# Patient Record
Sex: Female | Born: 1960 | Race: Black or African American | Hispanic: No | Marital: Married | Smoking: Never smoker
Health system: Southern US, Community
[De-identification: ages and names within clinical notes are randomized; demographics above are authoritative.]

## PROBLEM LIST (undated history)

## (undated) DIAGNOSIS — D649 Anemia, unspecified: Secondary | ICD-10-CM

## (undated) DIAGNOSIS — I10 Essential (primary) hypertension: Secondary | ICD-10-CM

## (undated) HISTORY — PX: TUBAL LIGATION: SHX77

---

## 2004-02-11 ENCOUNTER — Ambulatory Visit: Payer: Self-pay | Admitting: Unknown Physician Specialty

## 2005-11-02 ENCOUNTER — Ambulatory Visit: Payer: Self-pay | Admitting: Unknown Physician Specialty

## 2006-11-24 ENCOUNTER — Ambulatory Visit: Payer: Self-pay | Admitting: Unknown Physician Specialty

## 2008-02-20 ENCOUNTER — Ambulatory Visit: Payer: Self-pay | Admitting: Unknown Physician Specialty

## 2009-07-03 ENCOUNTER — Ambulatory Visit: Payer: Self-pay | Admitting: Internal Medicine

## 2010-12-22 ENCOUNTER — Ambulatory Visit: Payer: Self-pay | Admitting: Internal Medicine

## 2011-12-23 ENCOUNTER — Ambulatory Visit: Payer: Self-pay | Admitting: Internal Medicine

## 2013-11-08 ENCOUNTER — Ambulatory Visit: Payer: Self-pay | Admitting: Family Medicine

## 2015-04-01 ENCOUNTER — Other Ambulatory Visit: Payer: Self-pay | Admitting: Family Medicine

## 2015-04-01 DIAGNOSIS — Z1231 Encounter for screening mammogram for malignant neoplasm of breast: Secondary | ICD-10-CM

## 2016-10-20 ENCOUNTER — Other Ambulatory Visit: Payer: Self-pay | Admitting: Family Medicine

## 2016-10-20 DIAGNOSIS — Z1231 Encounter for screening mammogram for malignant neoplasm of breast: Secondary | ICD-10-CM

## 2016-11-05 ENCOUNTER — Other Ambulatory Visit: Payer: Self-pay | Admitting: Family Medicine

## 2016-11-05 DIAGNOSIS — D62 Acute posthemorrhagic anemia: Secondary | ICD-10-CM

## 2016-11-10 ENCOUNTER — Ambulatory Visit
Admission: RE | Admit: 2016-11-10 | Discharge: 2016-11-10 | Disposition: A | Discharge status: Home / Self Care | Payer: BLUE CROSS/BLUE SHIELD | Source: Ambulatory Visit | Attending: Family Medicine | Admitting: Family Medicine

## 2016-11-10 DIAGNOSIS — D649 Anemia, unspecified: Secondary | ICD-10-CM | POA: Diagnosis present

## 2016-11-10 DIAGNOSIS — D62 Acute posthemorrhagic anemia: Secondary | ICD-10-CM

## 2017-01-04 ENCOUNTER — Ambulatory Visit
Admission: RE | Admit: 2017-01-04 | Discharge: 2017-01-04 | Disposition: A | Discharge status: Home / Self Care | Payer: BLUE CROSS/BLUE SHIELD | Source: Ambulatory Visit | Attending: Family Medicine | Admitting: Family Medicine

## 2017-01-04 DIAGNOSIS — Z1231 Encounter for screening mammogram for malignant neoplasm of breast: Secondary | ICD-10-CM | POA: Diagnosis present

## 2017-03-04 ENCOUNTER — Encounter: Payer: Self-pay | Admitting: *Deleted

## 2017-03-04 ENCOUNTER — Emergency Department
Admission: EM | Admit: 2017-03-04 | Discharge: 2017-03-04 | Disposition: A | Discharge status: Home / Self Care | Payer: BLUE CROSS/BLUE SHIELD | Attending: Emergency Medicine | Admitting: Emergency Medicine

## 2017-03-04 ENCOUNTER — Other Ambulatory Visit: Payer: Self-pay

## 2017-03-04 DIAGNOSIS — N939 Abnormal uterine and vaginal bleeding, unspecified: Secondary | ICD-10-CM

## 2017-03-04 DIAGNOSIS — R000 Tachycardia, unspecified: Secondary | ICD-10-CM | POA: Diagnosis not present

## 2017-03-04 DIAGNOSIS — I10 Essential (primary) hypertension: Secondary | ICD-10-CM | POA: Insufficient documentation

## 2017-03-04 DIAGNOSIS — Z880 Allergy status to penicillin: Secondary | ICD-10-CM | POA: Insufficient documentation

## 2017-03-04 HISTORY — DX: Anemia, unspecified: D64.9

## 2017-03-04 HISTORY — DX: Essential (primary) hypertension: I10

## 2017-03-04 LAB — CBC WITH DIFFERENTIAL/PLATELET
BASOS ABS: 0 10*3/uL (ref 0–0.1)
Basophils Relative: 1 %
EOS PCT: 1 %
Eosinophils Absolute: 0.1 10*3/uL (ref 0–0.7)
HCT: 37.7 % (ref 35.0–47.0)
HEMOGLOBIN: 12.3 g/dL (ref 12.0–16.0)
LYMPHS ABS: 1.2 10*3/uL (ref 1.0–3.6)
LYMPHS PCT: 30 %
MCH: 28.9 pg (ref 26.0–34.0)
MCHC: 32.7 g/dL (ref 32.0–36.0)
MCV: 88.2 fL (ref 80.0–100.0)
Monocytes Absolute: 0.5 10*3/uL (ref 0.2–0.9)
Monocytes Relative: 13 %
NEUTROS PCT: 55 %
Neutro Abs: 2.3 10*3/uL (ref 1.4–6.5)
PLATELETS: 369 10*3/uL (ref 150–440)
RBC: 4.27 MIL/uL (ref 3.80–5.20)
RDW: 13.7 % (ref 11.5–14.5)
WBC: 4.1 10*3/uL (ref 3.6–11.0)

## 2017-03-04 LAB — COMPREHENSIVE METABOLIC PANEL
ALBUMIN: 4 g/dL (ref 3.5–5.0)
ALT: 11 U/L — AB (ref 14–54)
ANION GAP: 9 (ref 5–15)
AST: 21 U/L (ref 15–41)
Alkaline Phosphatase: 70 U/L (ref 38–126)
BUN: 10 mg/dL (ref 6–20)
CHLORIDE: 100 mmol/L — AB (ref 101–111)
CO2: 26 mmol/L (ref 22–32)
Calcium: 9.4 mg/dL (ref 8.9–10.3)
Creatinine, Ser: 0.7 mg/dL (ref 0.44–1.00)
GFR calc non Af Amer: >60 mL/min (ref >60)
GLUCOSE: 98 mg/dL (ref 65–99)
Potassium: 4 mmol/L (ref 3.5–5.1)
SODIUM: 135 mmol/L (ref 135–145)
Total Bilirubin: 0.5 mg/dL (ref 0.3–1.2)
Total Protein: 7.8 g/dL (ref 6.5–8.1)

## 2017-03-04 LAB — TYPE AND SCREEN
ABO/RH(D): O POS
Antibody Screen: NEGATIVE

## 2017-03-04 NOTE — ED Triage Notes (Signed)
Per patient's report, her periods have been absent for 3 months, but restarted 3 days ago and was light. Patient reports flow became heavy last night and was changing tampon and pad every hour. Patient states she spoke to her physician today who couldn't fit her in and was referred to the urgent care who told her they couldn't see her due to tachycardia.

## 2017-03-04 NOTE — ED Provider Notes (Signed)
[facility] Emergency Department Provider Note    ____________________________________________   I have reviewed the triage vital signs and the nursing notes.   HISTORY  Chief Complaint Vaginal Bleeding   History limited by: Not Limited   HPI Ameya Pinnix Camacho is a 57 y.o. female who presents to the emergency department today via [facility] because of concern for vaginal bleeding and tachycardia. The patient states that she has been having vaginal 3 weeks.  Prior to that she had gone 3 months without a period.  For the past 3 weeks she has had some light bleeding until today when it became more severe.  She denies any significant abdominal cramping.  She denies any shortness of breath or lightheadedness.  Denies any history of bleeding disorders.  Initially went to the [facility] where they found her to be tachycardic and sent her to the emergency department.  She states that she did have a transvaginal ultrasound performed at the end in the last year without any concerning findings.   Per medical record review patient has a history of anemia  Past Medical History:  Diagnosis Date  . Anemia   . Hypertension     There are no active problems to display for this patient.   Past Surgical History:  Procedure Laterality Date  . CESAREAN SECTION     x2  . TUBAL LIGATION     2003    Prior to Admission medications   Not on File    Allergies Penicillins  Family History  Problem Relation Age of Onset  . Breast cancer Maternal Aunt 1347    Social History Social History   Tobacco Use  . Smoking status: Never Smoker  . Smokeless tobacco: Never Used  Substance Use Topics  . Alcohol use: Yes    Comment: occasionally  . Drug use: No    Review of Systems Constitutional: No fever/chills Eyes: No visual changes. ENT: No sore throat. Cardiovascular: Denies chest pain. Respiratory: Denies shortness of breath. Gastrointestinal: No  abdominal pain.  No nausea, no vomiting.  No diarrhea.   Genitourinary: Positive for vaginal bleeding. Musculoskeletal: Negative for back pain. Skin: Negative for rash. Neurological: Negative for headaches, focal weakness or numbness.  ____________________________________________   PHYSICAL EXAM:  VITAL SIGNS: ED Triage Vitals  Enc Vitals Group     BP 03/04/17 1229 (!) 141/83     Pulse Rate 03/04/17 1229 (!) 108     Resp 03/04/17 1229 18     Temp 03/04/17 1229 99.1 F (37.3 C)     Temp Source 03/04/17 1229 Oral     SpO2 03/04/17 1229 100 %     Weight 03/04/17 1229 195 lb (88.5 kg)     Height 03/04/17 1229 5\' 6"  (1.676 m)     Head Circumference --      Peak Flow --      Pain Score 03/04/17 1238 0    Constitutional: Alert and oriented. Well appearing and in no distress. Eyes: Conjunctivae are normal.  ENT   Head: Normocephalic and atraumatic.   Nose: No congestion/rhinnorhea.   Mouth/Throat: Mucous membranes are moist.   Neck: No stridor. Hematological/Lymphatic/Immunilogical: No cervical lymphadenopathy. Cardiovascular: Normal rate, regular rhythm.  No murmurs, rubs, or gallops.  Respiratory: Normal respiratory effort without tachypnea nor retractions. Breath sounds are clear and equal bilaterally. No wheezes/rales/rhonchi. Gastrointestinal: Soft and non tender. No rebound. No guarding.  Genitourinary: Deferred Musculoskeletal: Normal range of motion in all extremities. No lower extremity edema.  Neurologic:  Normal speech and language. No gross focal neurologic deficits are appreciated.  Skin:  Skin is warm, dry and intact. No rash noted. Psychiatric: Mood and affect are normal. Speech and behavior are normal. Patient exhibits appropriate insight and judgment.  ____________________________________________    LABS (pertinent positives/negatives)  CBC wbc 4.1, hgb 12.3, plt 369 CMP wnl except chl 100, alt  11  ____________________________________________   EKG  None  ____________________________________________    RADIOLOGY  None  ____________________________________________   PROCEDURES  Procedures  ____________________________________________   INITIAL IMPRESSION / ASSESSMENT AND PLAN / ED COURSE  Pertinent labs & imaging results that were available during my care of the patient were reviewed by me and considered in my medical decision making (see chart for details).  Patient presented to the emergency department today because of concerns for abnormal vaginal bleeding and tachycardia.  By the time of my evaluation the patient was no longer tachycardic.  She also stated that she felt like her bleeding had slowed down since her time here in the emergency department.  Given the fact that she had an ultrasound performed roughly 5 months ago I do not feel another ultrasound is necessarily necessary.  Patient was okay deferring.  She states she does have appointment already scheduled with her primary care doctor in 3 days.  Patient is already on iron pills and hemoglobin levels within normal limits.  We did discuss blood loss return precautions.   ____________________________________________   FINAL CLINICAL IMPRESSION(S) / ED DIAGNOSES  Final diagnoses:  Abnormal vaginal bleeding     Note: This dictation was prepared with Dragon dictation. Any transcriptional errors that result from this process are unintentional     Phineas Semen, MD 03/04/17 1536

## 2017-03-04 NOTE — ED Notes (Signed)
Pt presents today for heavy menstral. [facility] and [facility] 113-130 they sent her here. Pt was changing ultra pad within every hour. Pt HR is now 85. Pt is NAD.

## 2017-03-04 NOTE — ED Notes (Signed)
Signature pad not working at time of discharge, pt expresses verbal understanding of paperwork with no further questions at this time.

## 2017-03-04 NOTE — Discharge Instructions (Signed)
Please seek medical attention for any high fevers, chest pain, shortness of breath, change in behavior, persistent vomiting, bloody stool or any other new or concerning symptoms.  

## 2018-06-28 ENCOUNTER — Other Ambulatory Visit: Payer: Self-pay | Admitting: Family Medicine

## 2018-08-14 ENCOUNTER — Telehealth: Payer: Self-pay | Admitting: *Deleted

## 2018-08-14 DIAGNOSIS — Z20822 Contact with and (suspected) exposure to covid-19: Secondary | ICD-10-CM

## 2018-08-14 NOTE — Telephone Encounter (Signed)
Pt scheduled for testing 08/18/2018 Friday. Requested by [facility] Dept  Pt with possible exposure  Testing process reviewed with pt, wear mask, stay in car; verbalizes understanding.  Pt CB# [phone]

## 2018-08-18 ENCOUNTER — Other Ambulatory Visit: Payer: BLUE CROSS/BLUE SHIELD

## 2018-08-18 DIAGNOSIS — Z20822 Contact with and (suspected) exposure to covid-19: Secondary | ICD-10-CM

## 2018-08-23 LAB — NOVEL CORONAVIRUS, NAA: SARS-CoV-2, NAA: NOT DETECTED

## 2019-06-28 ENCOUNTER — Other Ambulatory Visit: Payer: Self-pay | Admitting: Physician Assistant

## 2019-06-28 DIAGNOSIS — Z1231 Encounter for screening mammogram for malignant neoplasm of breast: Secondary | ICD-10-CM

## 2019-07-03 ENCOUNTER — Ambulatory Visit
Admission: RE | Admit: 2019-07-03 | Discharge: 2019-07-03 | Disposition: A | Discharge status: Home / Self Care | Payer: BC Managed Care – PPO | Source: Ambulatory Visit | Attending: Physician Assistant | Admitting: Physician Assistant

## 2019-07-03 DIAGNOSIS — Z1231 Encounter for screening mammogram for malignant neoplasm of breast: Secondary | ICD-10-CM | POA: Insufficient documentation

## 2021-06-04 ENCOUNTER — Other Ambulatory Visit: Payer: Self-pay | Admitting: Physician Assistant

## 2021-06-04 ENCOUNTER — Other Ambulatory Visit: Payer: Self-pay | Admitting: Family Medicine

## 2021-06-04 DIAGNOSIS — Z1231 Encounter for screening mammogram for malignant neoplasm of breast: Secondary | ICD-10-CM

## 2021-07-14 ENCOUNTER — Encounter: Payer: Self-pay | Admitting: Radiology

## 2021-07-14 ENCOUNTER — Ambulatory Visit
Admission: RE | Admit: 2021-07-14 | Discharge: 2021-07-14 | Disposition: A | Discharge status: Home / Self Care | Payer: BC Managed Care – PPO | Source: Ambulatory Visit | Attending: Physician Assistant | Admitting: Physician Assistant

## 2021-07-14 DIAGNOSIS — Z1231 Encounter for screening mammogram for malignant neoplasm of breast: Secondary | ICD-10-CM | POA: Insufficient documentation

## 2023-06-22 ENCOUNTER — Other Ambulatory Visit: Payer: Self-pay | Admitting: Nurse Practitioner

## 2023-06-22 DIAGNOSIS — Z1231 Encounter for screening mammogram for malignant neoplasm of breast: Secondary | ICD-10-CM

## 2023-07-18 LAB — COLOGUARD: COLOGUARD: NEGATIVE

## 2023-07-29 ENCOUNTER — Ambulatory Visit
Admission: RE | Admit: 2023-07-29 | Discharge: 2023-07-29 | Disposition: A | Discharge status: Home / Self Care | Source: Ambulatory Visit | Attending: Nurse Practitioner | Admitting: Nurse Practitioner

## 2023-07-29 DIAGNOSIS — Z1231 Encounter for screening mammogram for malignant neoplasm of breast: Secondary | ICD-10-CM | POA: Insufficient documentation

## 2023-07-31 IMAGING — MG MM DIGITAL SCREENING BILAT W/ TOMO AND CAD
8 series · 8 of 24 positions shown · non-contrast
Comparison: Previous exam(s).

ACR Breast Density Category a: The breast tissue is almost entirely
fatty.

CLINICAL DATA: Screening.

EXAM:
DIGITAL SCREENING BILATERAL MAMMOGRAM WITH TOMOSYNTHESIS AND CAD
TECHNIQUE: Bilateral screening digital craniocaudal and mediolateral oblique
mammograms were obtained. Bilateral screening digital breast
tomosynthesis was performed. The images were evaluated with
computer-aided detection.

[R MLO synth-2D]
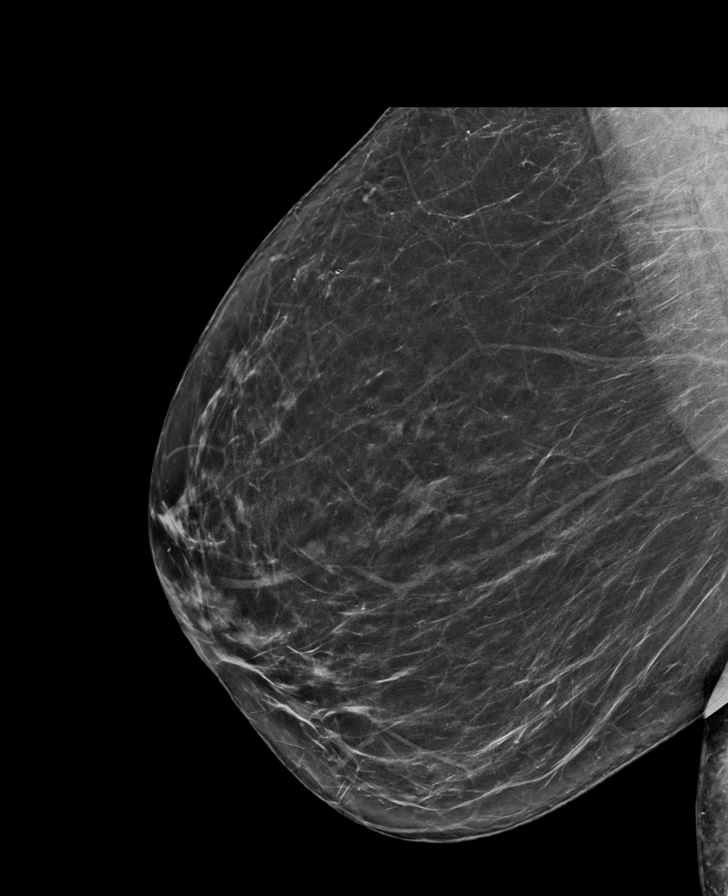

[R CC synth-2D]
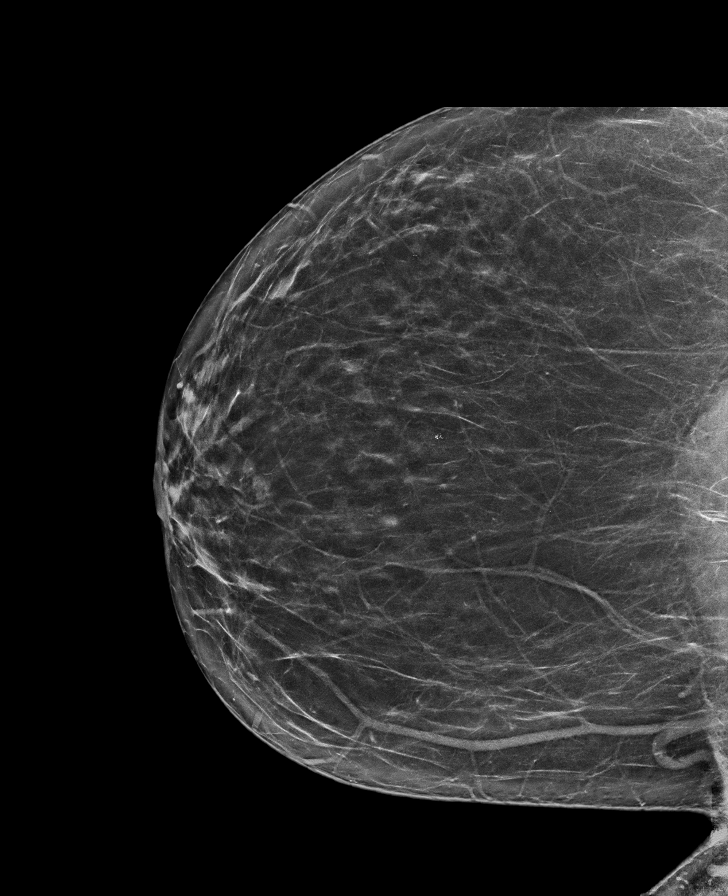

[L MLO synth-2D]
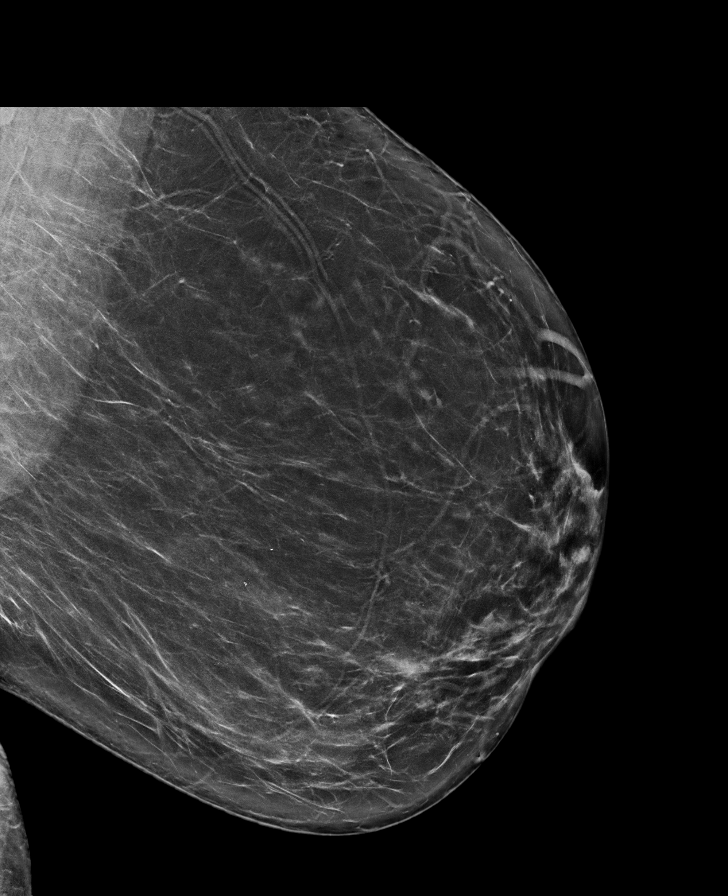

[L CC synth-2D]
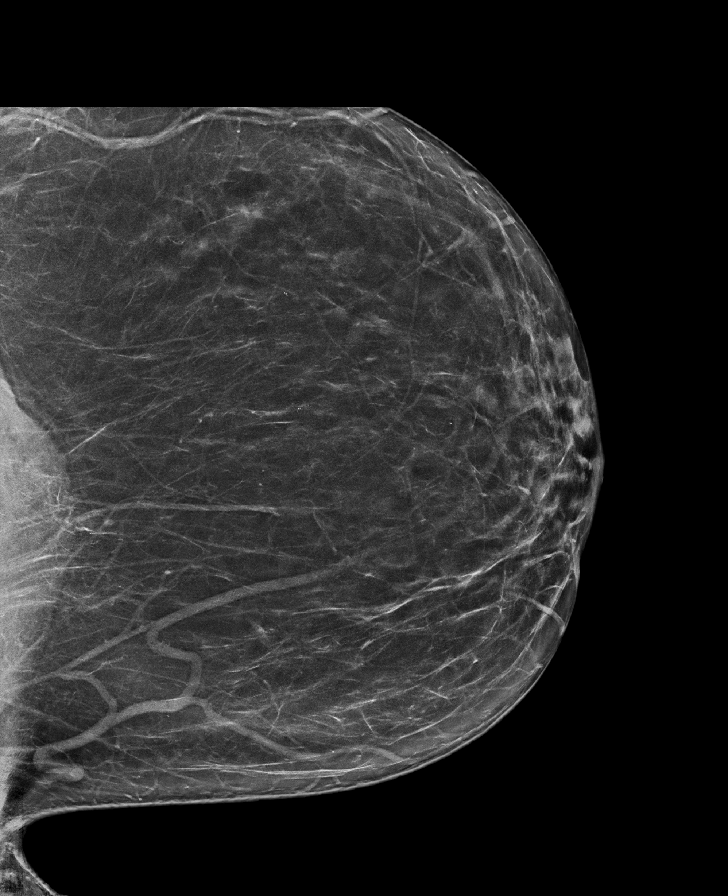

[R MLO tomo · tomo slice 39/78.0]
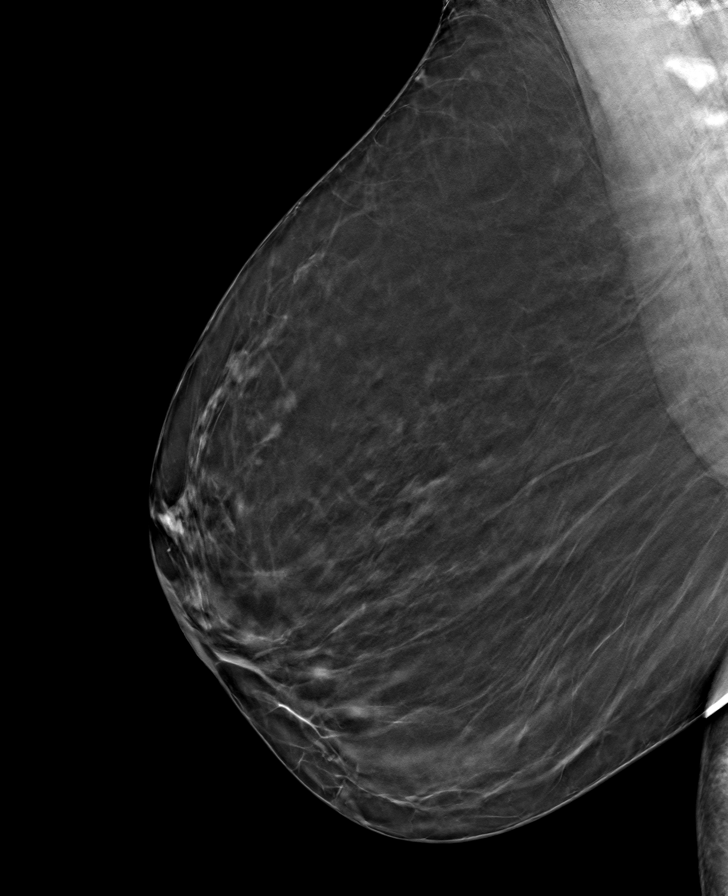

[L CC tomo · tomo slice 41/81.0]
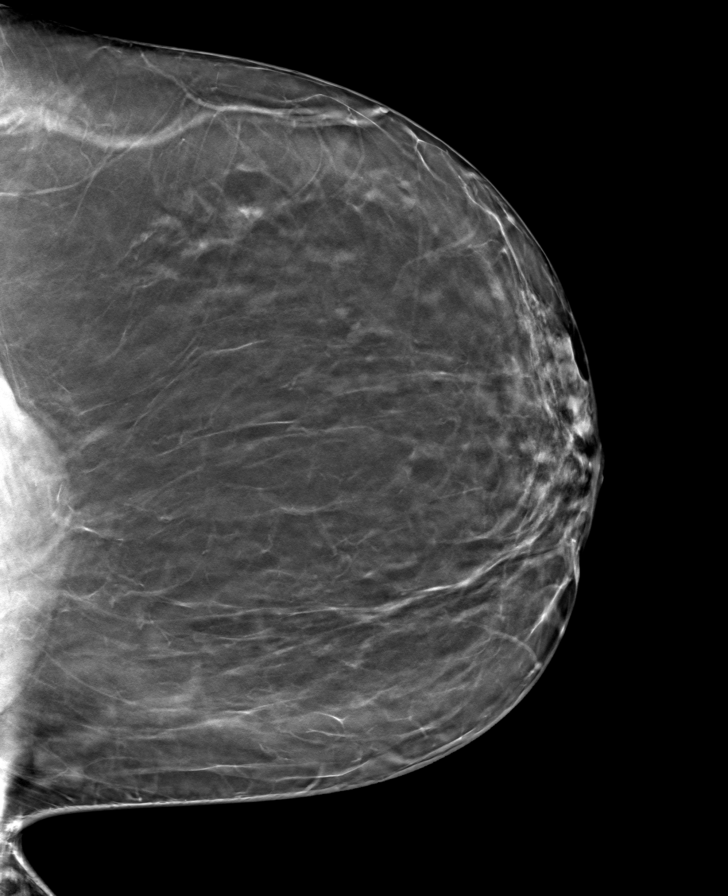

[L MLO tomo · tomo slice 43/84.0]
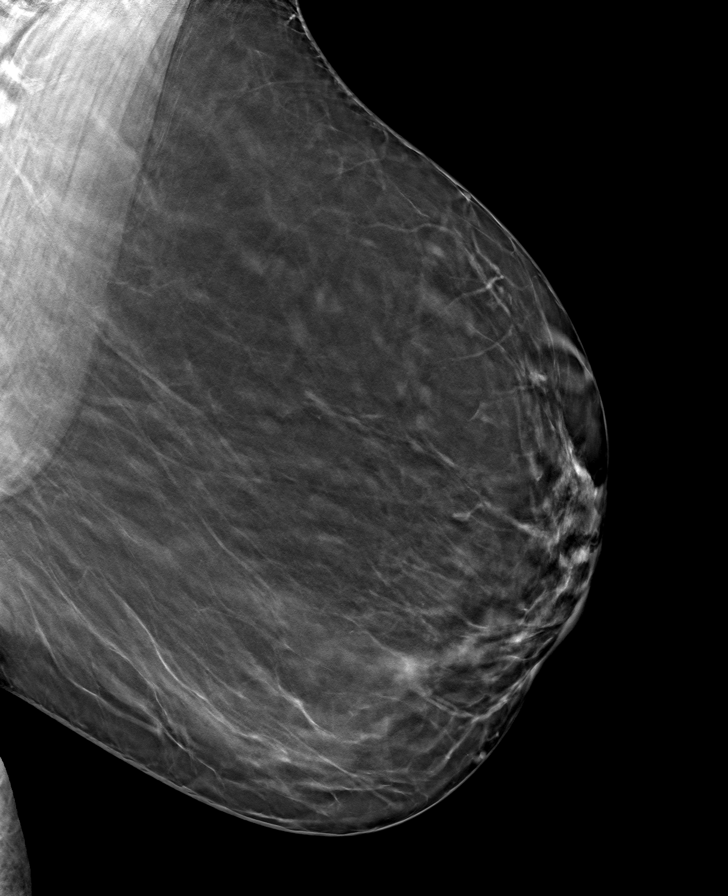

[R CC tomo · tomo slice 37/74.0]
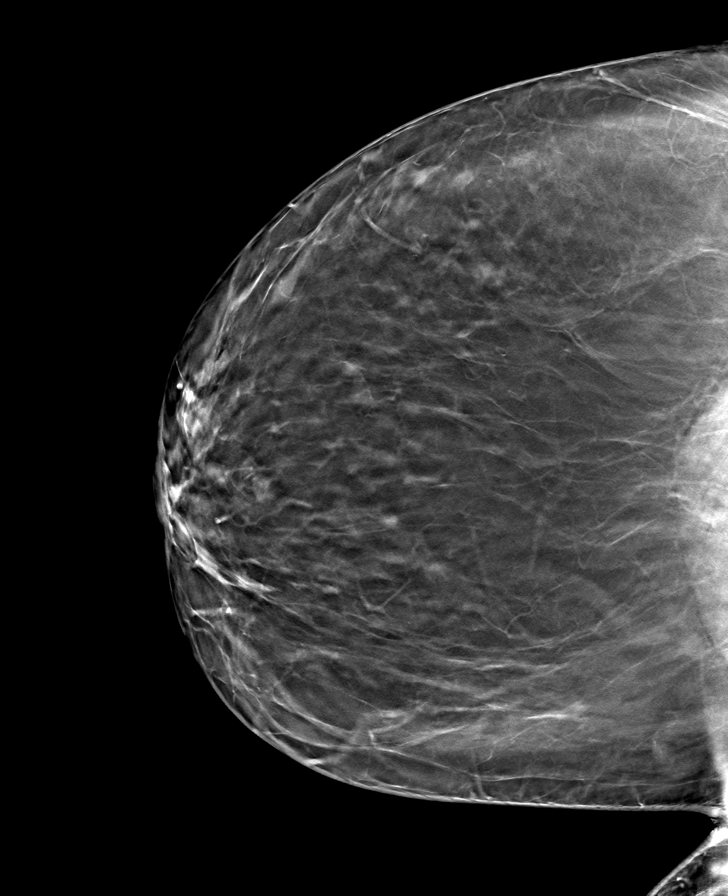

[8 of 24 positions shown; findings below may reference images not displayed]

FINDINGS: There are no findings suspicious for malignancy.
IMPRESSION: No mammographic evidence of malignancy. A result letter of this
screening mammogram will be mailed directly to the patient.

RECOMMENDATION:
Screening mammogram in one year. (Code:0E-3-N98)

BI-RADS CATEGORY  1: Negative.

## 2023-08-08 ENCOUNTER — Other Ambulatory Visit: Payer: Self-pay | Admitting: Nurse Practitioner

## 2023-08-08 DIAGNOSIS — R928 Other abnormal and inconclusive findings on diagnostic imaging of breast: Secondary | ICD-10-CM

## 2023-08-10 ENCOUNTER — Ambulatory Visit
Admission: RE | Admit: 2023-08-10 | Discharge: 2023-08-10 | Disposition: A | Discharge status: Home / Self Care | Source: Ambulatory Visit | Attending: Nurse Practitioner | Admitting: Nurse Practitioner

## 2023-08-10 DIAGNOSIS — R928 Other abnormal and inconclusive findings on diagnostic imaging of breast: Secondary | ICD-10-CM | POA: Diagnosis present

## 2023-12-23 ENCOUNTER — Ambulatory Visit (LOCAL_COMMUNITY_HEALTH_CENTER)

## 2023-12-23 DIAGNOSIS — Z23 Encounter for immunization: Secondary | ICD-10-CM | POA: Diagnosis not present

## 2023-12-23 DIAGNOSIS — Z719 Counseling, unspecified: Secondary | ICD-10-CM

## 2023-12-23 NOTE — Progress Notes (Signed)
 In nurse clinic requesting Covid Moderna vaccine.  Meets criteria for vaccine per [facility] Dr JAYSON Helling.  Counseled on vaccine.   Covid Moderna 2025-26 (67yrs+) given and tolerated well. Stayed for approx 15 min observation without problem. Updated [facility] copy given and reviewed. Saira Kramme, RN

## 2024-03-07 ENCOUNTER — Other Ambulatory Visit: Payer: Self-pay | Admitting: Nurse Practitioner

## 2024-03-07 DIAGNOSIS — N63 Unspecified lump in unspecified breast: Secondary | ICD-10-CM

## 2024-03-23 ENCOUNTER — Other Ambulatory Visit

## 2024-03-23 ENCOUNTER — Encounter
